# Patient Record
Sex: Male | Born: 2000 | Hispanic: No | Marital: Single | Smoking: Never smoker
Health system: Southern US, Community
[De-identification: ages and names within clinical notes are randomized; demographics above are authoritative.]

## PROBLEM LIST (undated history)

## (undated) HISTORY — PX: WISDOM TOOTH EXTRACTION: SHX21

---
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
## 2020-07-15 ENCOUNTER — Emergency Department (HOSPITAL_COMMUNITY)
Admission: EM | Admit: 2020-07-15 | Discharge: 2020-07-15 | Disposition: A | Discharge status: Home / Self Care | Payer: Medicaid Other | Attending: Emergency Medicine | Admitting: Emergency Medicine

## 2020-07-15 ENCOUNTER — Other Ambulatory Visit: Payer: Self-pay

## 2020-07-15 ENCOUNTER — Encounter (HOSPITAL_COMMUNITY): Payer: Self-pay | Admitting: Emergency Medicine

## 2020-07-15 DIAGNOSIS — M545 Low back pain, unspecified: Secondary | ICD-10-CM | POA: Insufficient documentation

## 2020-07-15 DIAGNOSIS — R1030 Lower abdominal pain, unspecified: Secondary | ICD-10-CM | POA: Diagnosis not present

## 2020-07-15 LAB — URINALYSIS, ROUTINE W REFLEX MICROSCOPIC
Bilirubin Urine: NEGATIVE
Glucose, UA: NEGATIVE mg/dL
Hgb urine dipstick: NEGATIVE
Ketones, ur: NEGATIVE mg/dL
Leukocytes,Ua: NEGATIVE
Nitrite: NEGATIVE
Protein, ur: NEGATIVE mg/dL
Specific Gravity, Urine: 1.027 (ref 1.005–1.030)
pH: 5 (ref 5.0–8.0)

## 2020-07-15 MED ORDER — IBUPROFEN 800 MG PO TABS: 800.0000 mg | ORAL_TABLET | Freq: Three times a day (TID) | ORAL | 0 refills | Status: DC

## 2020-07-15 MED ORDER — ACETAMINOPHEN 500 MG PO TABS
1000.0000 mg | ORAL_TABLET | Freq: Once | ORAL | Status: AC
  Administered 2020-07-15: 1000 mg via ORAL
  Filled 2020-07-15: qty 2

## 2020-07-15 NOTE — ED Provider Notes (Signed)
[facility] EMERGENCY DEPARTMENT Provider Note   CSN: [id number] Arrival date & time: 07/15/20  0848     History No chief complaint on file.   Richard Huff is a 20 y.o. male presenting for evaluation of lower abdominal pain.  Patient states when he woke this morning he was having suprapubic/lower abdominal pain.  Is constant, nothing makes it better or worse.  He has not taken anything for it including Tylenol or ibuprofen.  It does not radiate.  He denies associated fevers, chills, nausea, vomiting, or upper abdominal pain.  His last bowel movement was yesterday, was normal, no constipation today.  He states that the past 4 months he has had mild low back pain, worse with movement and ambulation.  He is not taking anything for this.  He has no other medical problems, takes no medications daily.  He denies penile discharge.  States he is not sexually active.  No concerns for STDs.  HPI     History reviewed. No pertinent past medical history.  There are no problems to display for this patient.   History reviewed. No pertinent surgical history.     History reviewed. No pertinent family history.  Social History   Tobacco Use  . Smoking status: Never Smoker  . Smokeless tobacco: Never Used  Substance Use Topics  . Alcohol use: No    Home Medications Prior to Admission medications   Medication Sig Start Date End Date Taking? Authorizing Provider  ibuprofen (ADVIL) 800 MG tablet Take 1 tablet (800 mg total) by mouth with breakfast, with lunch, and with evening meal. 07/15/20  Yes Travarus Trudo, PA-C    Allergies    Patient has no known allergies.  Review of Systems   Review of Systems  Gastrointestinal: Positive for abdominal pain (suprapubic).  All other systems reviewed and are negative.   Physical Exam Updated Vital Signs BP 135/82   Pulse 80   Temp 98 F (36.7 C) (Oral)   Resp 18   SpO2 100%   Physical Exam Vitals and  nursing note reviewed.  Constitutional:      General: He is not in acute distress.    Appearance: He is well-developed.     Comments: Resting in the bed in NAD  HENT:     Head: Normocephalic and atraumatic.  Eyes:     Extraocular Movements: Extraocular movements intact.     Conjunctiva/sclera: Conjunctivae normal.     Pupils: Pupils are equal, round, and reactive to light.  Cardiovascular:     Rate and Rhythm: Normal rate and regular rhythm.     Pulses: Normal pulses.  Pulmonary:     Effort: Pulmonary effort is normal. No respiratory distress.     Breath sounds: Normal breath sounds. No wheezing.  Abdominal:     General: There is no distension.     Palpations: Abdomen is soft. There is no mass.     Tenderness: There is abdominal tenderness. There is no guarding or rebound.     Comments: Tenderness palpation of suprapubic abdomen.  No tenderness palpation of the upper abdomen.  No CVA tenderness.  Musculoskeletal:        General: Normal range of motion.     Cervical back: Normal range of motion and neck supple.     Comments: No tenderness palpation of back or midline spine.  No step-offs or deformities.  No tenderness palpation of low back musculature.  Ambulatory without difficulty  Skin:    General: Skin  is warm and dry.     Capillary Refill: Capillary refill takes less than 2 seconds.  Neurological:     Mental Status: He is alert and oriented to person, place, and time.     ED Results / Procedures / Treatments   Labs (all labs ordered are listed, but only abnormal results are displayed) Labs Reviewed  URINALYSIS, ROUTINE W REFLEX MICROSCOPIC    EKG None  Radiology No results found.  Procedures Procedures   Medications Ordered in ED Medications  acetaminophen (TYLENOL) tablet 1,000 mg (1,000 mg Oral Given 07/15/20 0623)    ED Course  I have reviewed the triage vital signs and the nursing notes.  Pertinent labs & imaging results that were available during my  care of the patient were reviewed by me and considered in my medical decision making (see chart for details).    MDM Rules/Calculators/A&P                          Patient presented for evaluation of suprapubic pain.  On exam, patient appears nontoxic.  No fevers, chills, nausea, vomiting.  Doubt intra-abdominal infection, perforation, shortening, surgical abdomen.  Consider he UTI, though less likely without urinary symptoms.  Consider viral GI illness.  Consider MSK pain.  Consider STD, however patient denies sexual activity.  Will obtain urine and treat with Tylenol and reassess.  Urine negative for infection.  On reassessment after Tylenol, patient reports significant improvement of his pain.  Repeat abdominal exam shows minimal suprapubic discomfort, however no tenderness palpation also in the abdomen.  I continue to have low suspicion for concerning intra-abdominal infection that require emergent labs or CT imaging.  Will have patient continue to treat symptomatically, follow-up with PCP if not proving.  Strict return precautions given, including any signs of infection.  At this time, patient appears safe for discharge.  Return precautions given.  Patient states he understands and agrees to plan.  Final Clinical Impression(s) / ED Diagnoses Final diagnoses:  Lower abdominal pain    Rx / DC Orders ED Discharge Orders         Ordered    ibuprofen (ADVIL) 800 MG tablet  3 times daily with meals        07/15/20 1131           Danelia Snodgrass, PA-C 07/15/20 1314    Terrilee Files, MD 07/15/20 1726

## 2020-07-15 NOTE — ED Triage Notes (Signed)
Pt here from home with c/o low back pain that has been ongoing for a while , this morning his stomach started hurting some ,no n/v

## 2020-07-15 NOTE — Discharge Instructions (Addendum)
Take ibuprofen 3 times a day with meals.  Do not take other anti-inflammatories at the same time (Advil, Motrin, naproxen, Aleve). You may supplement with Tylenol if you need further pain control. Use ice packs or heating pads if this helps control your pain. It is important that you follow up with your primary care doctor for recheck of your symptoms.  This provider is not listed in your chart today. If you do not remember their name, there should be a number and/or name on your Medicaid card that you can establish with. Return to the emergency room if you develop fevers, vomiting, severe worsening pain, pain moves and stays in your right lower abdomen, if you develop blood in your urine, or with any new, worsening, or concerning symptoms.

## 2021-08-10 ENCOUNTER — Other Ambulatory Visit: Payer: Self-pay

## 2021-08-10 ENCOUNTER — Emergency Department (HOSPITAL_COMMUNITY)
Admission: EM | Admit: 2021-08-10 | Discharge: 2021-08-10 | Disposition: A | Discharge status: Home / Self Care | Payer: Medicaid Other | Attending: Emergency Medicine | Admitting: Emergency Medicine

## 2021-08-10 ENCOUNTER — Emergency Department (HOSPITAL_COMMUNITY): Payer: Medicaid Other

## 2021-08-10 ENCOUNTER — Encounter (HOSPITAL_COMMUNITY): Payer: Self-pay

## 2021-08-10 DIAGNOSIS — Y9366 Activity, soccer: Secondary | ICD-10-CM | POA: Insufficient documentation

## 2021-08-10 DIAGNOSIS — X501XXA Overexertion from prolonged static or awkward postures, initial encounter: Secondary | ICD-10-CM | POA: Insufficient documentation

## 2021-08-10 DIAGNOSIS — S8391XA Sprain of unspecified site of right knee, initial encounter: Secondary | ICD-10-CM | POA: Diagnosis not present

## 2021-08-10 DIAGNOSIS — S8991XA Unspecified injury of right lower leg, initial encounter: Secondary | ICD-10-CM | POA: Diagnosis present

## 2021-08-10 MED ORDER — IBUPROFEN 800 MG PO TABS: 800.0000 mg | ORAL_TABLET | Freq: Three times a day (TID) | ORAL | 0 refills | Status: AC

## 2021-08-10 NOTE — ED Provider Notes (Signed)
?[facility]-EMERGENCY DEPT ?Provider Note ? ? ?CSN: [id number] ?Arrival date & time: 08/10/21  0012 ? ?  ? ?History ? ?Chief Complaint  ?Patient presents with  ? Knee Injury  ? ? ?Richard Huff is a 21 y.o. male. ? ?Patient presents to the ER for evaluation of right knee pain.  Patient reports that he was playing soccer and his right foot slipped and he twisted the knee.  Patient complaining of pain around the kneecap area.  He reports that when he stands on the knee it seems like it is going to give out.  No other injury. ? ? ?  ? ?Home Medications ?Prior to Admission medications   ?Medication Sig Start Date End Date Taking? Authorizing Provider  ?ibuprofen (ADVIL) 800 MG tablet Take 1 tablet (800 mg total) by mouth with breakfast, with lunch, and with evening meal. 08/10/21   Cleland Simkins, Canary Brim, MD  ?   ? ?Allergies    ?Patient has no known allergies.   ? ?Review of Systems   ?Review of Systems ? ?Physical Exam ?Updated Vital Signs ?BP (!) 151/78 (BP Location: Right Arm)   Pulse 95   Temp 98.2 ?F (36.8 ?C) (Oral)   Resp 16   Ht 5\' 10"  (1.778 m)   Wt 102.1 kg   SpO2 98%   BMI 32.28 kg/m?  ?Physical Exam ?Musculoskeletal:  ?   Right knee: No swelling, deformity, effusion or ecchymosis. Normal range of motion. Tenderness present over the patellar tendon.  ?   Instability Tests: Anterior drawer test negative. Posterior drawer test negative.  ?   Right lower leg: Normal.  ?   Right ankle: Normal.  ? ? ?ED Results / Procedures / Treatments   ?Labs ?(all labs ordered are listed, but only abnormal results are displayed) ?Labs Reviewed - No data to display ? ?EKG ?None ? ?Radiology ?DG Knee Complete 4 Views Right ? ?Result Date: 08/10/2021 ?CLINICAL DATA:  Recent slip and fall with knee pain, initial encounter EXAM: RIGHT KNEE - COMPLETE 4+ VIEW COMPARISON:  None. FINDINGS: No evidence of fracture, dislocation, or joint effusion. No evidence of arthropathy or other focal bone  abnormality. Soft tissues are unremarkable. IMPRESSION: No acute abnormality noted. Electronically Signed   By: 08/12/2021 M.D.   On: 08/10/2021 00:43   ? ?Procedures ?Procedures  ? ? ?Medications Ordered in ED ?Medications - No data to display ? ?ED Course/ Medical Decision Making/ A&P ?  ?                        ?Medical Decision Making ?Amount and/or Complexity of Data Reviewed ?Radiology: ordered. ? ? ?Patient presents with right knee injury.  He reports instability with standing but examination is fairly unremarkable.  He has some slight tenderness at the infrapatellar region, but patella tendon is intact.  No ligamentous instability on exam.  He is very tentative to put any weight on the knee, cannot fully examine.  Will place in knee immobilizer, follow-up with orthopedics. ? ? ? ? ? ? ? ?Final Clinical Impression(s) / ED Diagnoses ?Final diagnoses:  ?Sprain of right knee, unspecified ligament, initial encounter  ? ? ?Rx / DC Orders ?ED Discharge Orders   ? ?      Ordered  ?  ibuprofen (ADVIL) 800 MG tablet  3 times daily with meals       ? 08/10/21 0233  ? ?  ?  ? ?  ? ? ?  ?  Gilda Crease, MD ?08/10/21 [phone] ? ?

## 2021-08-10 NOTE — ED Triage Notes (Signed)
Patient was playing soccer at 10pm and injured his right knee. Able to feel and wiggle his toes. Has sensation.  ?

## 2021-11-20 ENCOUNTER — Encounter: Payer: Self-pay | Admitting: Surgery

## 2021-11-20 ENCOUNTER — Ambulatory Visit (INDEPENDENT_AMBULATORY_CARE_PROVIDER_SITE_OTHER): Payer: Medicaid Other | Admitting: Surgery

## 2021-11-20 VITALS — BP 120/73 | HR 61 | Ht 70.0 in | Wt 225.0 lb

## 2021-11-20 DIAGNOSIS — G8929 Other chronic pain: Secondary | ICD-10-CM | POA: Diagnosis not present

## 2021-11-20 DIAGNOSIS — M25561 Pain in right knee: Secondary | ICD-10-CM | POA: Diagnosis not present

## 2021-12-05 ENCOUNTER — Ambulatory Visit: Payer: Medicaid Other | Admitting: Surgery

## 2021-12-10 ENCOUNTER — Other Ambulatory Visit: Payer: Self-pay | Admitting: Surgery

## 2021-12-10 DIAGNOSIS — G8929 Other chronic pain: Secondary | ICD-10-CM

## 2021-12-11 ENCOUNTER — Ambulatory Visit: Payer: Medicaid Other | Admitting: Surgery

## 2021-12-25 ENCOUNTER — Ambulatory Visit: Payer: Medicaid Other | Admitting: Surgery

## 2021-12-27 ENCOUNTER — Ambulatory Visit
Admission: RE | Admit: 2021-12-27 | Discharge: 2021-12-27 | Disposition: A | Discharge status: Home / Self Care | Payer: Medicaid Other | Source: Ambulatory Visit | Attending: Surgery | Admitting: Surgery

## 2021-12-27 DIAGNOSIS — G8929 Other chronic pain: Secondary | ICD-10-CM

## 2021-12-30 NOTE — Progress Notes (Signed)
Office Visit Note   Patient: Richard Huff           Date of Birth: 2000/11/30           MRN: [id number] Visit Date: 11/20/2021              Requested by: [facility] [address] [place],  [place] [address] PCP: Drue Flirt, MD   Assessment & Plan: Visit Diagnoses:  1. Chronic pain of right knee   2. Mechanical knee pain, right     Plan: with patients ongoing problem and failed conservative treatment I will schedule MRI.  Follow up in 2 weeks to discuss results.   Follow-Up Instructions: Return in about 2 weeks (around 12/04/2021) for [place] MRI SCAN.   Orders:  No orders of the defined types were placed in this encounter.  No orders of the defined types were placed in this encounter.     Procedures: No procedures performed   Clinical Data: No additional findings.   Subjective: Chief Complaint  Patient presents with   Right Knee - Pain    HPI 21 yo male comes in with complaints of right knee pain.  States that August 09, 2021 he was playing soccer when his foot slipped and twisted his knee.  Immediate pain and swelling after event.  Went to the [facility] long ED same day for evaluation. Xray report showed:   CLINICAL DATA:  Recent slip and fall with knee pain, initial encounter   EXAM: RIGHT KNEE - COMPLETE 4+ VIEW   COMPARISON:  None.   FINDINGS: No evidence of fracture, dislocation, or joint effusion. No evidence of arthropathy or other focal bone abnormality. Soft tissues are unremarkable.   IMPRESSION: No acute abnormality noted.     Electronically Signed   By: Inez Catalina M.D.   On: 08/10/2021 00:43   Describes having ongoing pain, mechanical symptoms and feeling of instability.  See by PCP 3 days ago and referred here.    Review of Systems No complaints of cardiac, pulmonary, gi, gu issues.   Objective: Vital Signs: BP 120/73   Pulse 61   Ht 5\' 10"  (1.778 m)   Wt  225 lb (102.1 kg)   BMI 32.28 kg/m   Physical Exam HENT:     Head: Normocephalic and atraumatic.     Nose: Nose normal.  Eyes:     Extraocular Movements: Extraocular movements intact.  Pulmonary:     Effort: No respiratory distress.  Musculoskeletal:     Comments: Gait somewhat antalgic.  Right knee swelling without large effusion.  Medial and lateral joint line tenderness.  Extensor mechanism intact.  Positive mcmurray.  Ligaments feel stable.   Neurological:     Mental Status: He is alert.  Psychiatric:        Mood and Affect: Mood normal.     Ortho Exam  Specialty Comments:  No specialty comments available.  Imaging: No results found.   PMFS History: There are no problems to display for this patient.  History reviewed. No pertinent past medical history.  History reviewed. No pertinent family history.  History reviewed. No pertinent surgical history. Social History   Occupational History   Not on file  Tobacco Use   Smoking status: Never   Smokeless tobacco: Never  Substance and Sexual Activity   Alcohol use: No   Drug use: Not on file   Sexual activity: Not on file

## 2021-12-31 ENCOUNTER — Ambulatory Visit: Payer: Medicaid Other | Admitting: Surgery

## 2022-01-01 ENCOUNTER — Ambulatory Visit: Payer: Medicaid Other | Admitting: Surgery

## 2022-01-02 ENCOUNTER — Ambulatory Visit (INDEPENDENT_AMBULATORY_CARE_PROVIDER_SITE_OTHER): Payer: Medicaid Other | Admitting: Surgery

## 2022-01-02 ENCOUNTER — Encounter: Payer: Self-pay | Admitting: Surgery

## 2022-01-02 VITALS — BP 139/76 | HR 70

## 2022-01-02 DIAGNOSIS — M25561 Pain in right knee: Secondary | ICD-10-CM

## 2022-01-02 DIAGNOSIS — G8929 Other chronic pain: Secondary | ICD-10-CM

## 2022-01-02 NOTE — Progress Notes (Signed)
Office Visit Note   Patient: Richard Huff           Date of Birth: 20-Jul-2000           MRN: [id number] Visit Date: 01/02/2022              Requested by: Drue Flirt, MD [phone] [place],  [place] [address] PCP: Drue Flirt, MD   Assessment & Plan: Visit Diagnoses:  1. Chronic pain of right knee   2. Mechanical knee pain, right     Plan: MRI reviewed with patient today.  At also previously reviewed scan with Dr. Alphonzo Severance.  On exam today he does have laxity with anterior drawer and Lachman on the right.  Follow-up with Dr. Marlou Sa next Wednesday morning to discuss scheduling of right knee arthroscopy with debridement, partial lateral meniscectomy versus repair and possible ACL reconstruction.  I briefly discussed procedures with patient.  Advised to Dr. Marlou Sa will go in more detail next week after repeat exam.  Follow-Up Instructions: Return in about 5 days (around 01/07/2022) for 8 AM Wednesday with Dr. Marlou Sa to discuss surgery.   Orders:  No orders of the defined types were placed in this encounter.  No orders of the defined types were placed in this encounter.     Procedures: No procedures performed   Clinical Data: No additional findings.   Subjective: Chief Complaint  Patient presents with   Right Knee - Pain    HPI 21 year old male with history of right knee pain mechanical symptoms and feeling of instability comes in for review of his MRI scan that was done December 29, 2021.  Study showed:  EXAM: MRI OF THE RIGHT KNEE WITHOUT CONTRAST   TECHNIQUE: Multiplanar, multisequence MR imaging of the right was performed. No intravenous contrast was administered.   COMPARISON:  Radiographs dated August 10, 2021   FINDINGS: MENISCI   Medial: Intact.   Lateral: Vertical longitudinal tear of the posterior horn/body of the lateral meniscus.   LIGAMENTS   Cruciates: ACL and PCL are intact.   Collaterals: Medial collateral  ligament is intact. Lateral collateral ligament complex is intact.   CARTILAGE   Patellofemoral:  No chondral defect.   Medial:  No chondral defect.   Lateral:  No chondral defect.   JOINT: Small joint effusion. Normal Hoffa's fat-pad. No plical thickening.   POPLITEAL FOSSA: Popliteus tendon is intact. No Baker's cyst.   EXTENSOR MECHANISM: Intact quadriceps tendon. Intact patellar tendon. Intact lateral patellar retinaculum. Intact medial patellar retinaculum. Intact MPFL.   BONES: There is a nondisplaced fracture of the posteromedial aspect of the medial tibial plateau without surrounding edema. No aggressive osseous lesion.   Other: No fluid collection or hematoma. Muscles are normal.   IMPRESSION: 1. Mildly displaced fracture of the posteromedial aspect of the medial tibial plateau, likely a subacute process.   2. Vertical longitudinal tear of the posterior horn/body of the lateral meniscus.   3.  Small joint effusion.   Cruciate and collateral ligaments are intact.     Electronically Signed   By: Keane Police D.O.   On: 12/29/2021 11:00  Continues have ongoing pain, swelling, Mechanical symptoms or feeling instability.   Objective: Vital Signs: BP 139/76   Pulse 70   Physical Exam Very pleasant male alert and oriented in no acute distress.  He does have small effusion right knee.  Joint line tender.  He does have some laxity with anterior drawer and Lachman's compared to  the left knee.  Difficult to get a good pivot shift since he is firing his quad due to some pain in the knee. Ortho Exam  Specialty Comments:  No specialty comments available.  Imaging: No results found.   PMFS History: There are no problems to display for this patient.  No past medical history on file.  No family history on file.  No past surgical history on file. Social History   Occupational History   Not on file  Tobacco Use   Smoking status: Never   Smokeless tobacco:  Never  Substance and Sexual Activity   Alcohol use: No   Drug use: Not on file   Sexual activity: Not on file

## 2022-01-07 ENCOUNTER — Ambulatory Visit (INDEPENDENT_AMBULATORY_CARE_PROVIDER_SITE_OTHER): Payer: Medicaid Other | Admitting: Orthopedic Surgery

## 2022-01-07 DIAGNOSIS — G8929 Other chronic pain: Secondary | ICD-10-CM

## 2022-01-07 DIAGNOSIS — S83511D Sprain of anterior cruciate ligament of right knee, subsequent encounter: Secondary | ICD-10-CM | POA: Diagnosis not present

## 2022-01-07 DIAGNOSIS — M25561 Pain in right knee: Secondary | ICD-10-CM

## 2022-01-09 ENCOUNTER — Encounter: Payer: Self-pay | Admitting: Orthopedic Surgery

## 2022-01-09 NOTE — Progress Notes (Signed)
Office Visit Note   Patient: Richard Huff           Date of Birth: 2000/06/20           MRN: [id number] Visit Date: 01/07/2022 Requested by: Verlon Au, MD [phone] [place],  [place] [address] PCP: Verlon Au, MD  Subjective: Chief Complaint  Patient presents with   Right Knee - Pain    HPI: Richard Huff is a 21 y.o. male who presents to the office with right knee pain.  Patient had an injury for 23 playing soccer.  He describes symptomatic instability with the right knee.  Patient states his knee does not lock.  He works in [employer].  He is able to do that but he does report pain and swelling in the knee at times.  He is otherwise healthy.  No personal or family history of DVT or pulmonary embolism.              ROS: All systems reviewed are negative as they relate to the chief complaint within the history of present illness.  Patient denies fevers or chills.  Assessment & Plan: Visit Diagnoses:  1. Chronic pain of right knee   2. Rupture of anterior cruciate ligament of right knee, subsequent encounter     Plan: Impression is right knee lateral meniscal tear which may or may not be repairable.  The ACL also appears to be partially torn by MRI scan and completely torn by exam.  He also has some degree of possible posterior lateral laxity which needs to be evaluated more fully at the time of surgery.  We discussed at length the risk and benefits of surgical intervention including not limited to infection or vessel damage potential need for more surgery stiffness as well as risk of recurrent tearing.  I think his best option would be quadriceps autograft ACL reconstruction with meniscal repair versus debridement.  Patient understands the nature of the risk and benefits as well as the rehab requirements.  All questions answered.  Follow-Up Instructions: No follow-ups on file.   Orders:  No orders of the defined types were placed  in this encounter.  No orders of the defined types were placed in this encounter.     Procedures: No procedures performed   Clinical Data: No additional findings.  Objective: Vital Signs: There were no vitals taken for this visit.  Physical Exam:  Constitutional: Patient appears well-developed HEENT:  Head: Normocephalic Eyes:EOM are normal Neck: Normal range of motion Cardiovascular: Normal rate Pulmonary/chest: Effort normal Neurologic: Patient is alert Skin: Skin is warm Psychiatric: Patient has normal mood and affect  Ortho Exam: Ortho exam demonstrates full active and passive range of motion of the right knee.  Lateral greater than medial joint line tenderness is present.  Patient does have positive Lachman positive anterior drawer which is substantially different from the left-hand side.  Collaterals are stable to varus valgus stress at 0 30 degrees.  He does have a slight amount of increased posterior lateral rotatory instability on the right compared to the left but it is likely in the order of of 5 to 10 degrees.  PCL intact.  Pedal pulses intact.  Ankle dorsiflexion intact.  Specialty Comments:  No specialty comments available.  Imaging: No results found.   PMFS History: There are no problems to display for this patient.  No past medical history on file.  No family history on file.  No past surgical history on file.  Social History   Occupational History   Not on file  Tobacco Use   Smoking status: Never   Smokeless tobacco: Never  Substance and Sexual Activity   Alcohol use: No   Drug use: Not on file   Sexual activity: Not on file

## 2022-04-17 ENCOUNTER — Telehealth: Payer: Self-pay | Admitting: Orthopedic Surgery

## 2022-04-17 NOTE — Telephone Encounter (Signed)
Patient need not for work stating he is having surgery and when and that he will be out of work until at least first post op appt

## 2022-04-22 NOTE — Telephone Encounter (Signed)
I called pt and advised him

## 2022-04-22 NOTE — Telephone Encounter (Signed)
Note completed 

## 2022-04-23 NOTE — Pre-Procedure Instructions (Signed)
Surgical Instructions    Your procedure is scheduled on Thursday, January 18.  Report to [facility] Main Entrance "A" at 5:30 A.M., then check in with the Admitting office.  Call this number if you have problems the morning of surgery:  [phone]   If you have any questions prior to your surgery date call [phone]: Open Monday-Friday 8am-4pm If you experience any cold or flu symptoms such as cough, fever, chills, shortness of breath, etc. between now and your scheduled surgery, please notify us at the above number     Remember:  Do not eat after midnight the night before your surgery  You may drink clear liquids until 4:30AM the morning of your surgery.   Clear liquids allowed are: Water, Non-Citrus Juices (without pulp), Carbonated Beverages, Clear Tea, Black Coffee ONLY (NO MILK, CREAM OR POWDERED CREAMER of any kind), and Gatorade  Patient Instructions  The night before surgery:  No food after midnight. ONLY clear liquids after midnight  The day of surgery (if you do NOT have diabetes):  Drink ONE (1) Pre-Surgery Clear Ensure by 4:30AM the morning of surgery. Drink in one sitting. Do not sip.  This drink was given to you during your hospital  pre-op appointment visit.  Nothing else to drink after completing the  Pre-Surgery Clear Ensure.           If you have questions, please contact your surgeon's office.     Take these medicines the morning of surgery with A SIP OF WATER: NONE   As of today, STOP taking any Aspirin (unless otherwise instructed by your surgeon) Aleve, Naproxen, Ibuprofen, Motrin, Advil, Goody's, BC's, all herbal medications, fish oil, and all vitamins.            [facility] is not responsible for any belongings or valuables.    Do NOT Smoke (Tobacco/Vaping)  24 hours prior to your procedure  If you use a CPAP at night, you may bring your mask for your overnight stay.   Contacts, glasses, hearing aids, dentures or partials may not be worn  into surgery, please bring cases for these belongings   For patients admitted to the hospital, discharge time will be determined by your treatment team.   Patients discharged the day of surgery will not be allowed to drive home, and someone needs to stay with them for 24 hours.   SURGICAL WAITING ROOM VISITATION Patients having surgery or a procedure may have no more than 2 support people in the waiting area - these visitors may rotate.   Children under the age of 17 must have an adult with them who is not the patient. If the patient needs to stay at the hospital during part of their recovery, the visitor guidelines for inpatient rooms apply. Pre-op nurse will coordinate an appropriate time for 1 support person to accompany patient in pre-op.  This support person may not rotate.   Please refer to [email or web address][employer]hu for the visitor guidelines for Inpatients (after your surgery is over and you are in a regular room).    Special instructions:    Oral Hygiene is also important to reduce your risk of infection.  Remember - BRUSH YOUR TEETH THE MORNING OF SURGERY WITH YOUR REGULAR TOOTHPASTE   [facility]- Preparing For Surgery  Before surgery, you can play an important role. Because skin is not sterile, your skin needs to be as free of germs as possible. You can reduce the number of germs on your skin by washing  with CHG (chlorahexidine gluconate) Soap before surgery.  CHG is an antiseptic cleaner which kills germs and bonds with the skin to continue killing germs even after washing.     Please do not use if you have an allergy to CHG or antibacterial soaps. If your skin becomes reddened/irritated stop using the CHG.  Do not shave (including legs and underarms) for at least 48 hours prior to first CHG shower. It is OK to shave your face.  Please follow these instructions carefully.     Shower the NIGHT BEFORE SURGERY and the MORNING OF  SURGERY with CHG Soap.   If you chose to wash your hair, wash your hair first as usual with your normal shampoo. After you shampoo, rinse your hair and body thoroughly to remove the shampoo.  Then [employer] and genitals (private parts) with your normal soap and rinse thoroughly to remove soap.  After that Use CHG Soap as you would any other liquid soap. You can apply CHG directly to the skin and wash gently with a scrungie or a clean washcloth.   Apply the CHG Soap to your body ONLY FROM THE NECK DOWN.  Do not use on open wounds or open sores. Avoid contact with your eyes, ears, mouth and genitals (private parts). Wash Face and genitals (private parts)  with your normal soap.   Wash thoroughly, paying special attention to the area where your surgery will be performed.  Thoroughly rinse your body with warm water from the neck down.  DO NOT shower/wash with your normal soap after using and rinsing off the CHG Soap.  Pat yourself dry with a CLEAN TOWEL.  Wear CLEAN PAJAMAS to bed the night before surgery  Place CLEAN SHEETS on your bed the night before your surgery  DO NOT SLEEP WITH PETS.   Day of Surgery:  Take a shower with CHG soap. Wear Clean/Comfortable clothing the morning of surgery Do not wear jewelry or makeup. Do not wear lotions, powders, perfumes/cologne or deodorant. Do not shave 48 hours prior to surgery.  Men may shave face and neck. Do not bring valuables to the hospital. Do not wear nail polish, gel polish, artificial nails, or any other type of covering on natural nails (fingers and toes) If you have artificial nails or gel coating that need to be removed by a nail salon, please have this removed prior to surgery. Artificial nails or gel coating may interfere with anesthesia's ability to adequately monitor your vital signs. Remember to brush your teeth WITH YOUR REGULAR TOOTHPASTE.    If you received a COVID test during your pre-op visit, it is requested that you  wear a mask when out in public, stay away from anyone that may not be feeling well, and notify your surgeon if you develop symptoms. If you have been in contact with anyone that has tested positive in the last 10 days, please notify your surgeon.    Please read over the following fact sheets that you were given.

## 2022-04-24 ENCOUNTER — Encounter (HOSPITAL_COMMUNITY): Payer: Self-pay

## 2022-04-24 ENCOUNTER — Other Ambulatory Visit: Payer: Self-pay

## 2022-04-24 ENCOUNTER — Encounter (HOSPITAL_COMMUNITY)
Admission: RE | Admit: 2022-04-24 | Discharge: 2022-04-24 | Disposition: A | Discharge status: Home / Self Care | Payer: Medicaid Other | Source: Ambulatory Visit | Attending: Orthopedic Surgery | Admitting: Orthopedic Surgery

## 2022-04-24 DIAGNOSIS — Z01812 Encounter for preprocedural laboratory examination: Secondary | ICD-10-CM | POA: Insufficient documentation

## 2022-04-24 DIAGNOSIS — Z01818 Encounter for other preprocedural examination: Secondary | ICD-10-CM

## 2022-04-24 LAB — CBC
HCT: 47 % (ref 39.0–52.0)
Hemoglobin: 16 g/dL (ref 13.0–17.0)
MCH: 29.5 pg (ref 26.0–34.0)
MCHC: 34 g/dL (ref 30.0–36.0)
MCV: 86.7 fL (ref 80.0–100.0)
Platelets: 341 10*3/uL (ref 150–400)
RBC: 5.42 MIL/uL (ref 4.22–5.81)
RDW: 13.8 % (ref 11.5–15.5)
WBC: 6.1 10*3/uL (ref 4.0–10.5)
nRBC: 0 % (ref 0.0–0.2)

## 2022-04-24 LAB — BASIC METABOLIC PANEL
Anion gap: 7 (ref 5–15)
BUN: 12 mg/dL (ref 6–20)
CO2: 26 mmol/L (ref 22–32)
Calcium: 9.4 mg/dL (ref 8.9–10.3)
Chloride: 103 mmol/L (ref 98–111)
Creatinine, Ser: 0.93 mg/dL (ref 0.61–1.24)
GFR, Estimated: >60 mL/min (ref >60)
Glucose, Bld: 93 mg/dL (ref 70–99)
Potassium: 3.9 mmol/L (ref 3.5–5.1)
Sodium: 136 mmol/L (ref 135–145)

## 2022-04-24 NOTE — Progress Notes (Signed)
PCP - Drue Flirt MD Cardiologist - denies  PPM/ICD - denies Device Orders -  Rep Notified -   Chest x-ray - na EKG - na Stress Test - denies ECHO - denies Cardiac Cath - denies  Sleep Study - none CPAP - none  Fasting Blood Sugar - na Checks Blood Sugar _____ times a day  Last dose of GLP1 agonist-  na GLP1 instructions:   Blood Thinner Instructions:na Aspirin Instructions:na  ERAS Protcol -clear liquids until 0430 PRE-SURGERY Ensure or G2- Ensure  COVID TEST- na   Anesthesia review: no  Patient denies shortness of breath, fever, cough and chest pain at PAT appointment   All instructions explained to the patient, with a verbal understanding of the material. Patient agrees to go over the instructions while at home for a better understanding. Patient also instructed to wear a mask when out in public prior to surgery. The opportunity to ask questions was provided.

## 2022-04-30 ENCOUNTER — Ambulatory Visit (HOSPITAL_COMMUNITY): Admission: RE | Admit: 2022-04-30 | Payer: Medicaid Other | Source: Ambulatory Visit | Admitting: Orthopedic Surgery

## 2022-04-30 DIAGNOSIS — Z01818 Encounter for other preprocedural examination: Secondary | ICD-10-CM

## 2022-04-30 SURGERY — RECONSTRUCTION, KNEE, ACL
Anesthesia: General | Site: Knee | Laterality: Right

## 2022-05-01 ENCOUNTER — Encounter: Payer: Medicaid Other | Admitting: Surgical

## 2022-05-08 ENCOUNTER — Encounter: Payer: Medicaid Other | Admitting: Surgical

## 2023-02-20 IMAGING — CR DG KNEE COMPLETE 4+V*R*
4 series · 4 of 4 positions shown · non-contrast
Comparison: None.

CLINICAL DATA: Recent slip and fall with knee pain, initial
encounter

EXAM:
RIGHT KNEE - COMPLETE 4+ VIEW

[t knee ap right (1 of 2)]
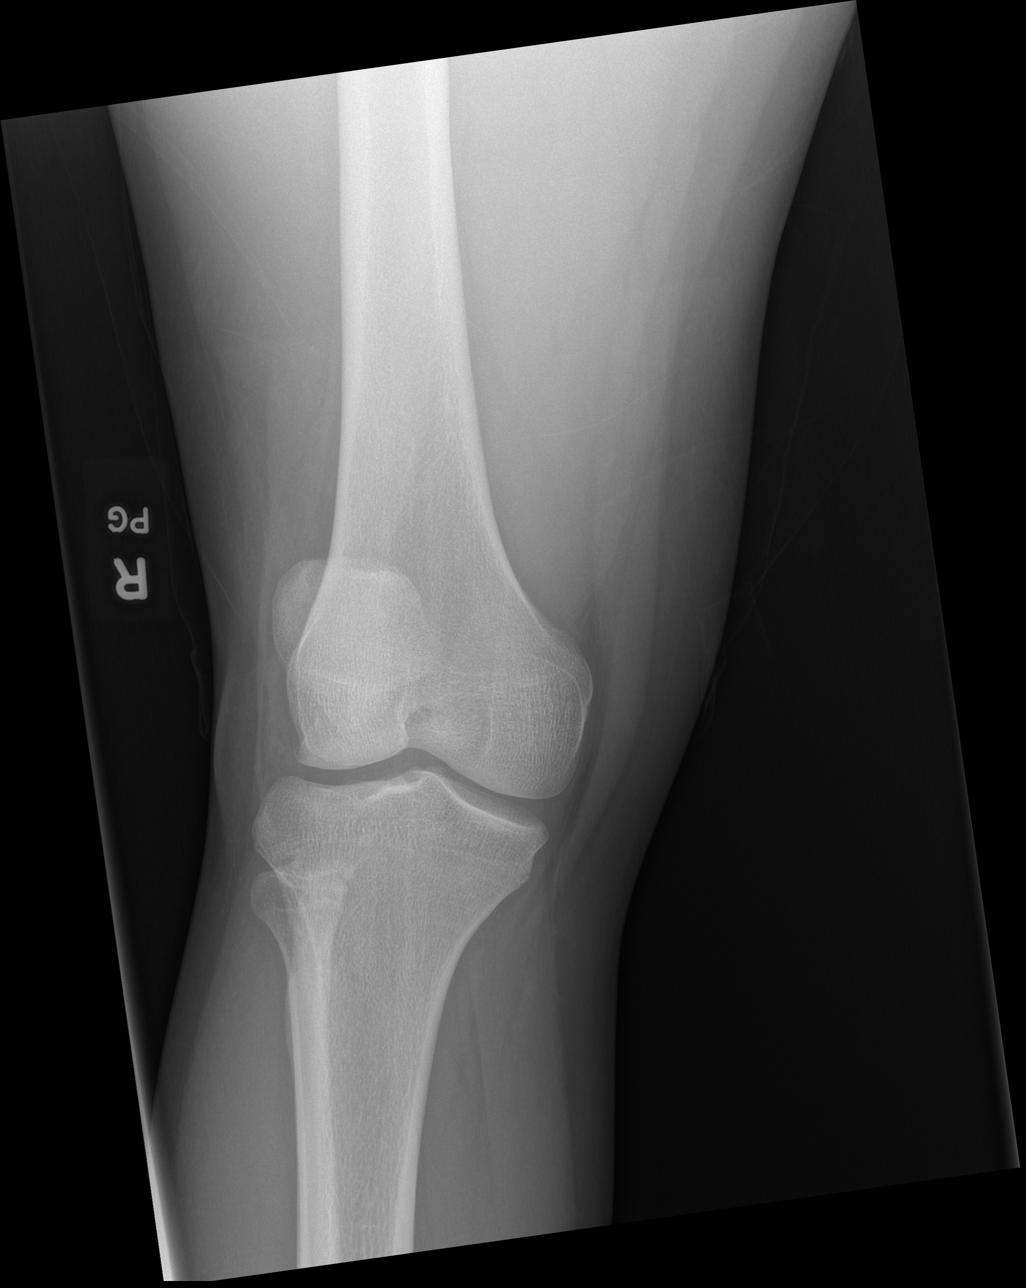

[t knee ap right (2 of 2)]
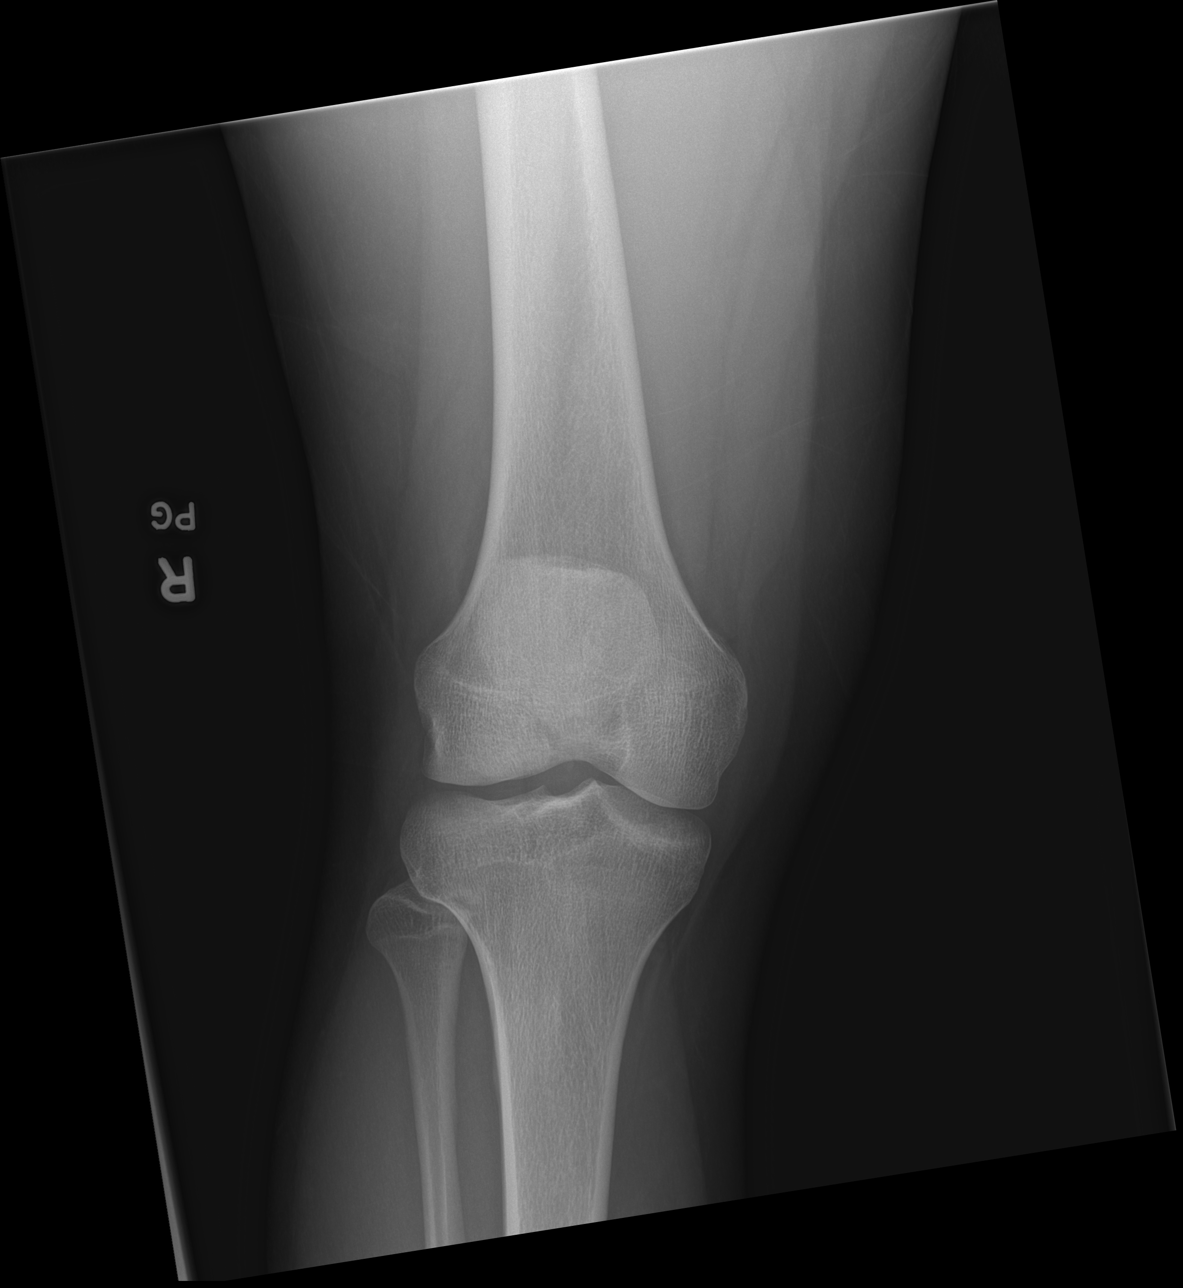

[t knee obl right]
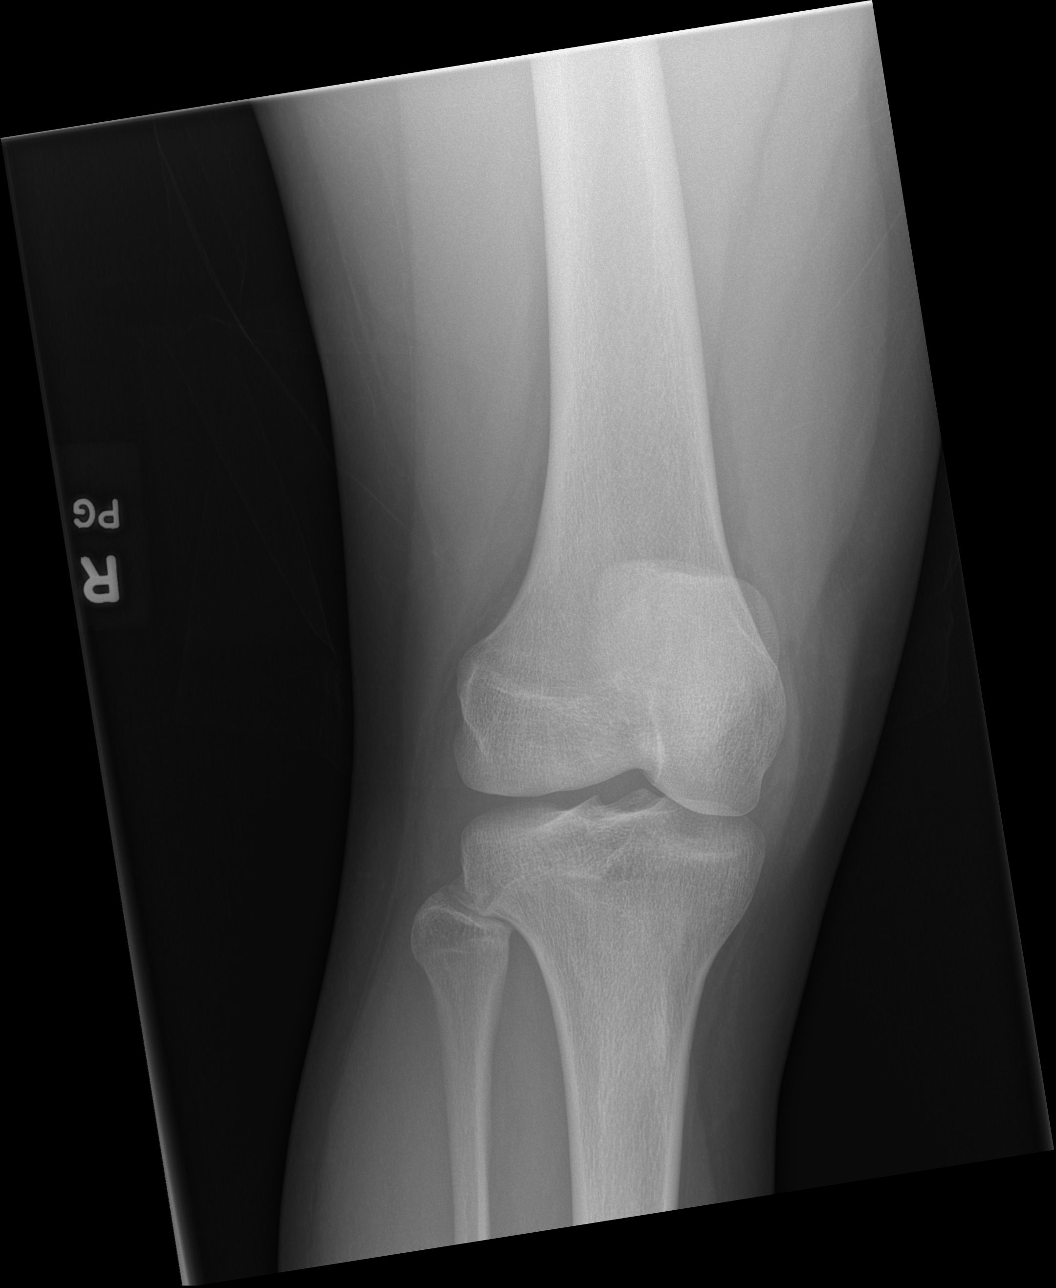

[t knee lat right]
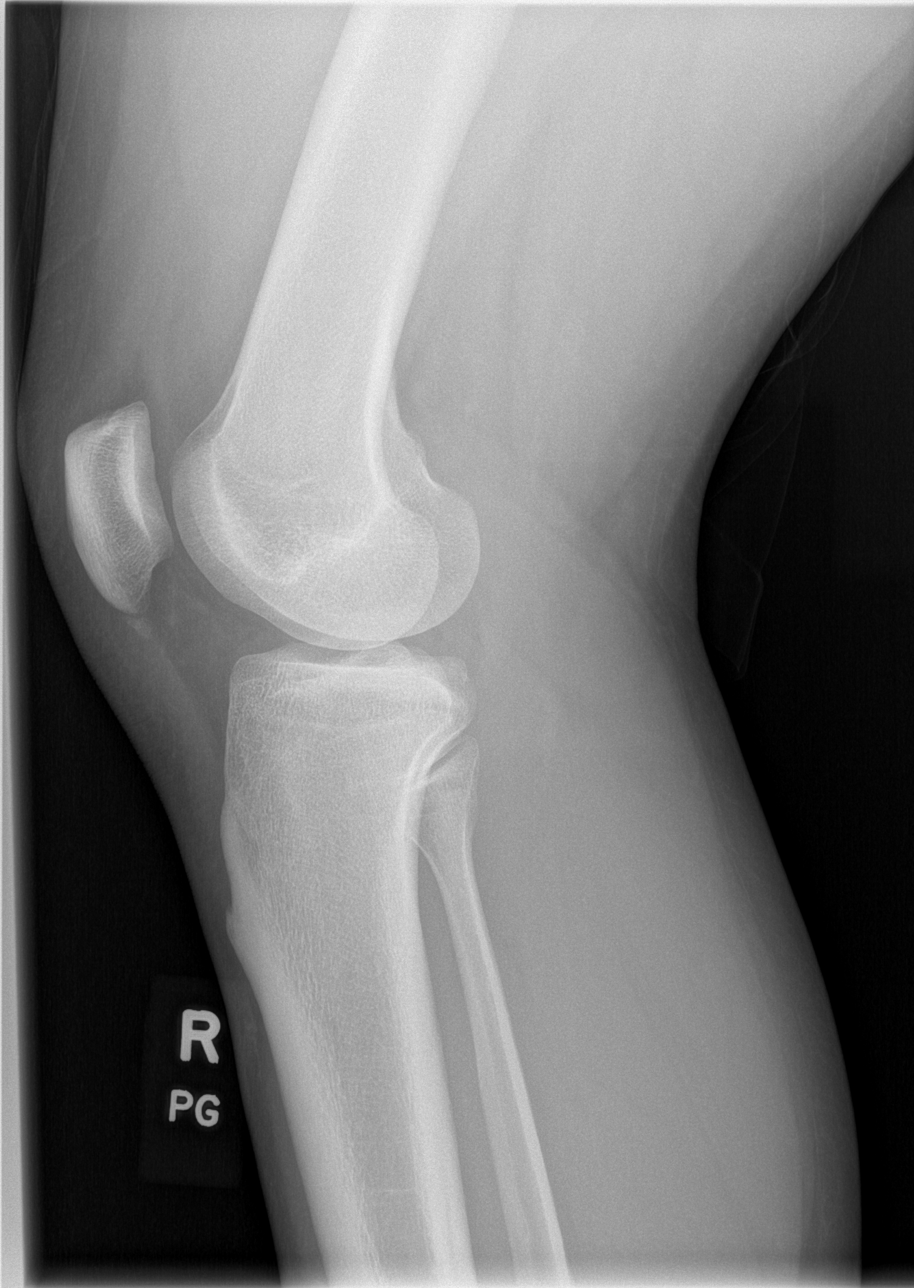

[4 of 4 positions shown; findings below may reference images not displayed]

FINDINGS: No evidence of fracture, dislocation, or joint effusion. No evidence
of arthropathy or other focal bone abnormality. Soft tissues are
unremarkable.
IMPRESSION: No acute abnormality noted.
